# Patient Record
Sex: Female | Born: 1985 | Race: Black or African American | Hispanic: No | Marital: Single | Smoking: Former smoker
Health system: Southern US, Community
[De-identification: ages and names within clinical notes are randomized; demographics above are authoritative.]

## PROBLEM LIST (undated history)

## (undated) DIAGNOSIS — I10 Essential (primary) hypertension: Secondary | ICD-10-CM

## (undated) HISTORY — PX: KNEE SURGERY: SHX244

---

## 2011-04-08 ENCOUNTER — Emergency Department (HOSPITAL_BASED_OUTPATIENT_CLINIC_OR_DEPARTMENT_OTHER)
Admission: EM | Admit: 2011-04-08 | Discharge: 2011-04-08 | Disposition: A | Discharge status: Home / Self Care | Payer: Self-pay | Attending: Emergency Medicine | Admitting: Emergency Medicine

## 2011-04-08 ENCOUNTER — Encounter: Payer: Self-pay | Admitting: Emergency Medicine

## 2011-04-08 DIAGNOSIS — K029 Dental caries, unspecified: Secondary | ICD-10-CM | POA: Insufficient documentation

## 2011-04-08 MED ORDER — HYDROCODONE-ACETAMINOPHEN 5-325 MG PO TABS
2.0000 | ORAL_TABLET | Freq: Once | ORAL | Status: AC
  Administered 2011-04-08: 2 via ORAL
  Filled 2011-04-08: qty 2

## 2011-04-08 MED ORDER — CLINDAMYCIN HCL 150 MG PO CAPS: 150.0000 mg | ORAL_CAPSULE | Freq: Four times a day (QID) | ORAL | Status: AC

## 2011-04-08 MED ORDER — HYDROCODONE-ACETAMINOPHEN 5-325 MG PO TABS: 2.0000 | ORAL_TABLET | ORAL | Status: AC | PRN

## 2011-04-08 NOTE — ED Provider Notes (Signed)
History     CSN: [id number] Arrival date & time: 04/08/2011  1:18 PM   First MD Initiated Contact with Patient 04/08/11 1323      Chief Complaint  Patient presents with  . Dental Pain    (Consider location/radiation/quality/duration/timing/severity/associated sxs/prior treatment) Patient is a 25 y.o. female presenting with tooth pain. The history is provided by the patient. No language interpreter was used.  Dental PainThe primary symptoms include mouth pain and oral lesions. The symptoms began less than 1 hour ago. The symptoms are worsening. The symptoms are chronic. The symptoms occur constantly.  Mouth pain began more than 1 week ago. Mouth pain occurs constantly. Mouth pain is worsening. Affected locations include: teeth. At its highest the mouth pain was at 7/10. The mouth pain is currently at 7/10.  Additional symptoms include: dental sensitivity to temperature and gum swelling. Medical issues do not include: alcohol problem and smoking.    History reviewed. No pertinent past medical history.  Past Surgical History  Procedure Date  . Knee surgery     History reviewed. No pertinent family history.  History  Substance Use Topics  . Smoking status: Not on file  . Smokeless tobacco: Not on file  . Alcohol Use: Yes     social drinker    OB History    Grav Para Term Preterm Abortions TAB SAB Ect Mult Living                  Review of Systems  Unable to perform ROS HENT: Positive for dental problem.   All other systems reviewed and are negative.    Allergies  Review of patient's allergies indicates no known allergies.  Home Medications  No current outpatient prescriptions on file.  BP 140/96  Pulse 60  Temp(Src) 97.7 F (36.5 C) (Oral)  Resp 12  Ht 5\' 5"  (1.651 m)  Wt 270 lb (122.471 kg)  BMI 44.93 kg/m2  SpO2 98%  Physical Exam  Nursing note and vitals reviewed. Constitutional: She appears well-developed and well-nourished.  HENT:  Head:  Normocephalic and atraumatic.  Right Ear: External ear normal.  Left Ear: External ear normal.  Nose: Nose normal.  Mouth/Throat: Oropharynx is clear and moist.       Broken right lower 1st molar,  Multiple caries  Eyes: Pupils are equal, round, and reactive to light.  Neck: Normal range of motion.  Cardiovascular: Normal rate.   Pulmonary/Chest: Effort normal.  Abdominal: Soft.  Musculoskeletal: Normal range of motion.  Neurological: She is alert.  Skin: Skin is warm.  Psychiatric: She has a normal mood and affect.    ED Course  Procedures (including critical care time)  Labs Reviewed - No data to display No results found.   No diagnosis found.    MDM  Pt referred to dentist for evaluation        Langston Masker, [place] 04/08/11 1334  Langston Masker, [place] 04/08/11 1342

## 2011-04-08 NOTE — ED Provider Notes (Signed)
Medical screening examination/treatment/procedure(s) were performed by non-physician practitioner and as supervising physician I was immediately available for consultation/collaboration.   Lyanne Co, MD 04/08/11 [phone]

## 2011-04-08 NOTE — ED Notes (Signed)
Pt has toothpain located lower right jaw.  States tooth is cracked. Can't eat. Has headache and earache.

## 2011-07-23 ENCOUNTER — Encounter (HOSPITAL_BASED_OUTPATIENT_CLINIC_OR_DEPARTMENT_OTHER): Payer: Self-pay | Admitting: Student

## 2011-07-23 ENCOUNTER — Emergency Department (HOSPITAL_BASED_OUTPATIENT_CLINIC_OR_DEPARTMENT_OTHER)
Admission: EM | Admit: 2011-07-23 | Discharge: 2011-07-23 | Disposition: A | Discharge status: Home / Self Care | Payer: Self-pay | Attending: Emergency Medicine | Admitting: Emergency Medicine

## 2011-07-23 DIAGNOSIS — N760 Acute vaginitis: Secondary | ICD-10-CM | POA: Insufficient documentation

## 2011-07-23 DIAGNOSIS — B9689 Other specified bacterial agents as the cause of diseases classified elsewhere: Secondary | ICD-10-CM | POA: Insufficient documentation

## 2011-07-23 DIAGNOSIS — R10819 Abdominal tenderness, unspecified site: Secondary | ICD-10-CM | POA: Insufficient documentation

## 2011-07-23 DIAGNOSIS — F17200 Nicotine dependence, unspecified, uncomplicated: Secondary | ICD-10-CM | POA: Insufficient documentation

## 2011-07-23 DIAGNOSIS — A499 Bacterial infection, unspecified: Secondary | ICD-10-CM | POA: Insufficient documentation

## 2011-07-23 DIAGNOSIS — R110 Nausea: Secondary | ICD-10-CM | POA: Insufficient documentation

## 2011-07-23 LAB — URINE MICROSCOPIC-ADD ON

## 2011-07-23 LAB — URINALYSIS, ROUTINE W REFLEX MICROSCOPIC
Nitrite: NEGATIVE
Protein, ur: NEGATIVE mg/dL
Specific Gravity, Urine: 1.03 (ref 1.005–1.030)
Urobilinogen, UA: 0.2 mg/dL (ref 0.0–1.0)

## 2011-07-23 LAB — WET PREP, GENITAL
Trich, Wet Prep: NONE SEEN
Yeast Wet Prep HPF POC: NONE SEEN

## 2011-07-23 LAB — PREGNANCY, URINE: Preg Test, Ur: NEGATIVE

## 2011-07-23 MED ORDER — METRONIDAZOLE 500 MG PO TABS: 500.0000 mg | ORAL_TABLET | Freq: Two times a day (BID) | ORAL | Status: AC

## 2011-07-23 NOTE — ED Notes (Signed)
Pt in with c/o lower abdominal pain and N since Monday. Denies V D CP LOC SOB.Denies any prior hx. Denies dysuria, vaginal discharge.

## 2011-07-23 NOTE — Discharge Instructions (Signed)
Bacterial Vaginosis Bacterial vaginosis (BV) is a vaginal infection where the normal balance of bacteria in the vagina is disrupted. The normal balance is then replaced by an overgrowth of certain bacteria. There are several different kinds of bacteria that can cause BV. BV is the most common vaginal infection in women of childbearing age. CAUSES   The cause of BV is not fully understood. BV develops when there is an increase or imbalance of harmful bacteria.   Some activities or behaviors can upset the normal balance of bacteria in the vagina and put women at increased risk including:   Having a new sex partner or multiple sex partners.   Douching.   Using an intrauterine device (IUD) for contraception.   It is not clear what role sexual activity plays in the development of BV. However, women that have never had sexual intercourse are rarely infected with BV.  Women do not get BV from toilet seats, bedding, swimming pools or from touching objects around them.  SYMPTOMS   Grey vaginal discharge.   A fish-like odor with discharge, especially after sexual intercourse.   Itching or burning of the vagina and vulva.   Burning or pain with urination.   Some women have no signs or symptoms at all.  DIAGNOSIS  Your caregiver must examine the vagina for signs of BV. Your caregiver will perform lab tests and look at the sample of vaginal fluid through a microscope. They will look for bacteria and abnormal cells (clue cells), a pH test higher than 4.5, and a positive amine test all associated with BV.  RISKS AND COMPLICATIONS   Pelvic inflammatory disease (PID).   Infections following gynecology surgery.   Developing HIV.   Developing herpes virus.  TREATMENT  Sometimes BV will clear up without treatment. However, all women with symptoms of BV should be treated to avoid complications, especially if gynecology surgery is planned. Female partners generally do not need to be treated. However,  BV may spread between female sex partners so treatment is helpful in preventing a recurrence of BV.   BV may be treated with antibiotics. The antibiotics come in either pill or vaginal cream forms. Either can be used with nonpregnant or pregnant women, but the recommended dosages differ. These antibiotics are not harmful to the baby.   BV can recur after treatment. If this happens, a second round of antibiotics will often be prescribed.   Treatment is important for pregnant women. If not treated, BV can cause a premature delivery, especially for a pregnant woman who had a premature birth in the past. All pregnant women who have symptoms of BV should be checked and treated.   For chronic reoccurrence of BV, treatment with a type of prescribed gel vaginally twice a week is helpful.  HOME CARE INSTRUCTIONS   Finish all medication as directed by your caregiver.   Do not have sex until treatment is completed.   Tell your sexual partner that you have a vaginal infection. They should see their caregiver and be treated if they have problems, such as a mild rash or itching.   Practice safe sex. Use condoms. Only have 1 sex partner.  PREVENTION  Basic prevention steps can help reduce the risk of upsetting the natural balance of bacteria in the vagina and developing BV:  Do not have sexual intercourse (be abstinent).   Do not douche.   Use all of the medicine prescribed for treatment of BV, even if the signs and symptoms go away.     Tell your sex partner if you have BV. That way, they can be treated, if needed, to prevent reoccurrence.  SEEK MEDICAL CARE IF:   Your symptoms are not improving after 3 days of treatment.   You have increased discharge, pain, or fever.  MAKE SURE YOU:   Understand these instructions.   Will watch your condition.   Will get help right away if you are not doing well or get worse.  FOR MORE INFORMATION  Division of STD Prevention (DSTDP), Centers for Disease  Control and Prevention: www.cdc.gov/std American [employer] Association (ASHA): www.ashastd.org  Document Released: 04/21/2005 Document Revised: 04/10/2011 Document Reviewed: 10/12/2008 Exit[employer] Patient Information 2012 [employer]. 

## 2011-07-23 NOTE — ED Provider Notes (Signed)
History     CSN: [id number]  Arrival date & time 07/23/11  2130   First MD Initiated Contact with Patient 07/23/11 1922      Chief Complaint  Patient presents with  . Abdominal Pain  . Nausea    (Consider location/radiation/quality/duration/timing/severity/associated sxs/prior treatment) Patient is a 26 y.o. female presenting with abdominal pain. The history is provided by the patient.  Abdominal Pain The primary symptoms of the illness include abdominal pain and nausea. The primary symptoms of the illness do not include vomiting, dysuria, vaginal discharge or vaginal bleeding. Episode onset: Symptoms started 2 weeks ago but worsened since Monday. The onset of the illness was gradual. The problem has been gradually worsening.  The abdominal pain is located in the suprapubic region. The abdominal pain does not radiate. The severity of the abdominal pain is 5/10. The abdominal pain is relieved by nothing. Exacerbated by: nothing.  The patient states that she believes she is currently not pregnant. Additional symptoms associated with the illness include back pain. Symptoms associated with the illness do not include chills, anorexia, constipation, urgency or frequency. Associated medical issues comments: Has not had her period since February.    No past medical history on file.  Past Surgical History  Procedure Date  . Knee surgery     No family history on file.  History  Substance Use Topics  . Smoking status: Current Everyday Smoker -- 1.0 packs/day  . Smokeless tobacco: Not on file  . Alcohol Use: Yes     social drinker    OB History    Grav Para Term Preterm Abortions TAB SAB Ect Mult Living                  Review of Systems  Constitutional: Negative for chills.  Gastrointestinal: Positive for nausea and abdominal pain. Negative for vomiting, constipation and anorexia.  Genitourinary: Negative for dysuria, urgency, frequency, vaginal bleeding and vaginal discharge.    Musculoskeletal: Positive for back pain.  All other systems reviewed and are negative.    Allergies  Review of patient's allergies indicates no known allergies.  Home Medications  No current outpatient prescriptions on file.  BP 152/81  Pulse 72  Temp(Src) 98.2 F (36.8 C) (Oral)  Resp 20  Wt 261 lb 4.8 oz (118.525 kg)  SpO2 99%  LMP 06/09/2011  Physical Exam  Nursing note and vitals reviewed. Constitutional: She is oriented to person, place, and time. She appears well-developed and well-nourished. No distress.  HENT:  Head: Normocephalic and atraumatic.  Eyes: EOM are normal. Pupils are equal, round, and reactive to light.  Cardiovascular: Normal rate, regular rhythm, normal heart sounds and intact distal pulses.  Exam reveals no friction rub.   No murmur heard. Pulmonary/Chest: Effort normal and breath sounds normal. She has no wheezes. She has no rales.  Abdominal: Soft. Bowel sounds are normal. She exhibits no distension. There is tenderness in the suprapubic area. There is no rebound, no guarding and no CVA tenderness.  Genitourinary: Vagina normal. Uterus is tender. Cervix exhibits discharge. Cervix exhibits no motion tenderness. Right adnexum displays no mass, no tenderness and no fullness. Left adnexum displays no mass, no tenderness and no fullness.  Musculoskeletal: Normal range of motion. She exhibits no tenderness.       No edema  Neurological: She is alert and oriented to person, place, and time. No cranial nerve deficit.  Skin: Skin is warm and dry. No rash noted.  Psychiatric: She has a normal mood  and affect. Her behavior is normal.    ED Course  Procedures (including critical care time)  Labs Reviewed  URINALYSIS, ROUTINE W REFLEX MICROSCOPIC - Abnormal; Notable for the following:    APPearance CLOUDY (*)    Hgb urine dipstick SMALL (*)    All other components within normal limits  WET PREP, GENITAL - Abnormal; Notable for the following:    Clue Cells  Wet Prep HPF POC MODERATE (*)    WBC, Wet Prep HPF POC FEW (*)    All other components within normal limits  URINE MICROSCOPIC-ADD ON - Abnormal; Notable for the following:    Squamous Epithelial / LPF FEW (*)    Bacteria, UA FEW (*)    All other components within normal limits  PREGNANCY, URINE  GC/CHLAMYDIA PROBE AMP, GENITAL   No results found.   No diagnosis found.    MDM   Patient with suprapubic pain that has been worsening for the last 2 weeks and nausea without vomiting. Denies any fever, bowel changes. LMP was the beginning of February. Patient states she's taking multiple pregnancy tests at home that have been negative. She denies any dysuria or vaginal discharge. No new partners and is only sexually active with one person. Pelvic exam has uterine tenderness but no other concerning symptoms. UPT negative, UA negative for signs of infection. Wet prep pending. No symptoms on exam concerning for appendicitis, diverticulitis, pancreatitis or cholecystitis. Symptoms do not suggest pyelonephritis or renal calculi at this time.  9:11 PM A negative for infection. UPT within normal limits. Wet prep moderate clue cells but otherwise negative. Patient treated with Flagyl and given a followup to women's clinic.      Gwyneth Sprout, MD 07/23/11 2111

## 2011-07-24 LAB — GC/CHLAMYDIA PROBE AMP, GENITAL: GC Probe Amp, Genital: NEGATIVE

## 2011-07-25 NOTE — ED Notes (Signed)
+   Chlamydia Chart sent to EDP  Office for review.

## 2011-07-26 NOTE — ED Notes (Signed)
Chart returned from EDP office. Patient prescribed Doxycycline 100 mg PO BID x 7 days. Prescribed by Trixie Dredge PA-C.

## 2011-07-27 NOTE — ED Notes (Signed)
Attempted to call patient yesterday and today. No answer.

## 2012-02-24 ENCOUNTER — Encounter (HOSPITAL_BASED_OUTPATIENT_CLINIC_OR_DEPARTMENT_OTHER): Payer: Self-pay | Admitting: *Deleted

## 2012-02-24 ENCOUNTER — Emergency Department (HOSPITAL_BASED_OUTPATIENT_CLINIC_OR_DEPARTMENT_OTHER): Payer: Medicaid Other

## 2012-02-24 ENCOUNTER — Emergency Department (HOSPITAL_BASED_OUTPATIENT_CLINIC_OR_DEPARTMENT_OTHER)
Admission: EM | Admit: 2012-02-24 | Discharge: 2012-02-24 | Disposition: A | Discharge status: Home / Self Care | Payer: Medicaid Other | Attending: Emergency Medicine | Admitting: Emergency Medicine

## 2012-02-24 DIAGNOSIS — Z87891 Personal history of nicotine dependence: Secondary | ICD-10-CM | POA: Insufficient documentation

## 2012-02-24 DIAGNOSIS — R1110 Vomiting, unspecified: Secondary | ICD-10-CM

## 2012-02-24 DIAGNOSIS — O2390 Unspecified genitourinary tract infection in pregnancy, unspecified trimester: Secondary | ICD-10-CM | POA: Insufficient documentation

## 2012-02-24 DIAGNOSIS — F1210 Cannabis abuse, uncomplicated: Secondary | ICD-10-CM | POA: Insufficient documentation

## 2012-02-24 DIAGNOSIS — O210 Mild hyperemesis gravidarum: Secondary | ICD-10-CM | POA: Insufficient documentation

## 2012-02-24 DIAGNOSIS — R1013 Epigastric pain: Secondary | ICD-10-CM | POA: Insufficient documentation

## 2012-02-24 DIAGNOSIS — N390 Urinary tract infection, site not specified: Secondary | ICD-10-CM

## 2012-02-24 LAB — COMPREHENSIVE METABOLIC PANEL
BUN: 7 mg/dL (ref 6–23)
CO2: 25 mEq/L (ref 19–32)
Chloride: 100 mEq/L (ref 96–112)
Creatinine, Ser: 0.5 mg/dL (ref 0.50–1.10)
GFR calc Af Amer: >90 mL/min (ref >90)
GFR calc non Af Amer: >90 mL/min (ref >90)
Glucose, Bld: 85 mg/dL (ref 70–99)
Total Bilirubin: 0.2 mg/dL — ABNORMAL LOW (ref 0.3–1.2)

## 2012-02-24 LAB — CBC WITH DIFFERENTIAL/PLATELET
HCT: 36.9 % (ref 36.0–46.0)
Hemoglobin: 12.9 g/dL (ref 12.0–15.0)
Lymphocytes Relative: 38 % (ref 12–46)
MCV: 85.6 fL (ref 78.0–100.0)
Monocytes Absolute: 0.8 10*3/uL (ref 0.1–1.0)
Monocytes Relative: 14 % — ABNORMAL HIGH (ref 3–12)
Neutro Abs: 2.8 10*3/uL (ref 1.7–7.7)
WBC: 6.1 10*3/uL (ref 4.0–10.5)

## 2012-02-24 LAB — URINE MICROSCOPIC-ADD ON

## 2012-02-24 LAB — URINALYSIS, ROUTINE W REFLEX MICROSCOPIC
Bilirubin Urine: NEGATIVE
Ketones, ur: NEGATIVE mg/dL
Nitrite: NEGATIVE
Specific Gravity, Urine: 1.023 (ref 1.005–1.030)
Urobilinogen, UA: 0.2 mg/dL (ref 0.0–1.0)

## 2012-02-24 LAB — HCG, QUANTITATIVE, PREGNANCY: hCG, Beta Chain, Quant, S: 55426 m[IU]/mL — ABNORMAL HIGH (ref <5)

## 2012-02-24 LAB — LIPASE, BLOOD: Lipase: 12 U/L (ref 11–59)

## 2012-02-24 MED ORDER — ONDANSETRON HCL 4 MG/2ML IJ SOLN: 4.0000 mg | Freq: Once | INTRAMUSCULAR | Status: DC

## 2012-02-24 MED ORDER — NITROFURANTOIN MONOHYD MACRO 100 MG PO CAPS
100.0000 mg | ORAL_CAPSULE | Freq: Once | ORAL | Status: AC
  Administered 2012-02-24: 100 mg via ORAL
  Filled 2012-02-24: qty 1

## 2012-02-24 MED ORDER — SODIUM CHLORIDE 0.9 % IV BOLUS (SEPSIS)
1000.0000 mL | Freq: Once | INTRAVENOUS | Status: AC
  Administered 2012-02-24: 1000 mL via INTRAVENOUS

## 2012-02-24 MED ORDER — METOCLOPRAMIDE HCL 10 MG PO TABS: 10.0000 mg | ORAL_TABLET | Freq: Four times a day (QID) | ORAL | Status: AC | PRN

## 2012-02-24 MED ORDER — NITROFURANTOIN MONOHYD MACRO 100 MG PO CAPS: 100.0000 mg | ORAL_CAPSULE | Freq: Two times a day (BID) | ORAL | Status: AC

## 2012-02-24 NOTE — ED Provider Notes (Signed)
History     CSN: [id number]  Arrival date & time 02/24/12  1114   First MD Initiated Contact with Patient 02/24/12 1209      Chief Complaint  Patient presents with  . Abdominal Pain    (Consider location/radiation/quality/duration/timing/severity/associated sxs/prior treatment) The history is provided by the patient.  Kourtni Gregor is a 26 y.o. female here with abdominal pain and vomiting. She has intermittent vomiting for the last 2 weeks associated with epigastric pain. She also had some epigastric cramps as well. Denies any dysuria or fevers or constipation or diarrhea. LMP 2 months ago and denies vaginal discharge. She chronically uses marijuana and states that using marijuana makes it feel better.    History reviewed. No pertinent past medical history.  Past Surgical History  Procedure Date  . Knee surgery     No family history on file.  History  Substance Use Topics  . Smoking status: Former Smoker -- 1.0 packs/day    Types: Cigarettes  . Smokeless tobacco: Not on file  . Alcohol Use: No     social drinker    OB History    Grav Para Term Preterm Abortions TAB SAB Ect Mult Living                  Review of Systems  Gastrointestinal: Positive for nausea, vomiting and abdominal pain.  All other systems reviewed and are negative.    Allergies  Review of patient's allergies indicates no known allergies.  Home Medications   Current Outpatient Rx  Name Route Sig Dispense Refill  . METOCLOPRAMIDE HCL 10 MG PO TABS Oral Take 1 tablet (10 mg total) by mouth every 6 (six) hours as needed (nausea/headache). 10 tablet 0    BP 143/84  Pulse 63  Temp 98.4 F (36.9 C) (Oral)  Resp 20  Ht 5\' 3"  (1.6 m)  Wt 257 lb (116.574 kg)  BMI 45.53 kg/m2  SpO2 99%  LMP 12/30/2011  Physical Exam  Nursing note and vitals reviewed. Constitutional: She is oriented to person, place, and time. She appears well-developed and well-nourished.  HENT:  Head: Normocephalic.    Mouth/Throat: Oropharynx is clear and moist.  Eyes: Conjunctivae normal are normal. Pupils are equal, round, and reactive to light.  Neck: Normal range of motion. Neck supple.  Cardiovascular: Normal rate, regular rhythm and normal heart sounds.   Pulmonary/Chest: Effort normal and breath sounds normal.  Abdominal: Soft. Bowel sounds are normal.       Obese, mild epigastric tenderness, no RUQ tenderness. No lower abdominal tenderness.   Musculoskeletal: Normal range of motion.  Neurological: She is alert and oriented to person, place, and time.  Skin: Skin is warm and dry.  Psychiatric: She has a normal mood and affect. Her behavior is normal. Judgment and thought content normal.    ED Course  Procedures (including critical care time)  Labs Reviewed  URINALYSIS, ROUTINE W REFLEX MICROSCOPIC - Abnormal; Notable for the following:    Leukocytes, UA SMALL (*)     All other components within normal limits  PREGNANCY, URINE - Abnormal; Notable for the following:    Preg Test, Ur POSITIVE (*)     All other components within normal limits  URINE MICROSCOPIC-ADD ON - Abnormal; Notable for the following:    Squamous Epithelial / LPF FEW (*)     Bacteria, UA FEW (*)     All other components within normal limits  CBC WITH DIFFERENTIAL - Abnormal; Notable for the following:  Monocytes Relative 14 (*)     All other components within normal limits  COMPREHENSIVE METABOLIC PANEL - Abnormal; Notable for the following:    Total Bilirubin 0.2 (*)     All other components within normal limits  LIPASE, BLOOD  HCG, QUANTITATIVE, PREGNANCY   US Ob Comp Less 14 Wks  02/24/2012  *RADIOLOGY REPORT*  Clinical Data: Left-sided pain, nausea and vomiting, positive pregnancy test  OBSTETRIC <14 WK [place] AND TRANSVAGINAL OB US  Technique:  Both transabdominal and transvaginal ultrasound examinations were performed for complete evaluation of the gestation as well as the maternal uterus, adnexal regions, and  pelvic cul-de-sac.  Transvaginal technique was performed to assess early pregnancy.  Comparison:  None.  Intrauterine gestational sac:  Visualized/normal in shape. Yolk sac: Visualized Embryo: Visualized Cardiac Activity: Visualized Heart Rate: 158 bpm  CRL: 13  mm  7 w  3 d         [place] EDC: 10/09/12  Maternal uterus/adnexae: Ovaries not visualized.  No adnexal mass.  No free fluid.  IMPRESSION: Intrauterine gestational sac, yolk sac, and fetal pole identified. Gestational age by today's crown-rump length 7 weeks 3 days is concordant with assigned gestational age by LMP of 8 weeks 0 days, EDC by LMP 10/05/2012.  No acute abnormality.   Original Report Authenticated By: Harrel Lemon, M.D.    US Ob Transvaginal  02/24/2012  *RADIOLOGY REPORT*  Clinical Data: Left-sided pain, nausea and vomiting, positive pregnancy test  OBSTETRIC <14 WK [place] AND TRANSVAGINAL OB US  Technique:  Both transabdominal and transvaginal ultrasound examinations were performed for complete evaluation of the gestation as well as the maternal uterus, adnexal regions, and pelvic cul-de-sac.  Transvaginal technique was performed to assess early pregnancy.  Comparison:  None.  Intrauterine gestational sac:  Visualized/normal in shape. Yolk sac: Visualized Embryo: Visualized Cardiac Activity: Visualized Heart Rate: 158 bpm  CRL: 13  mm  7 w  3 d         [place] EDC: 10/09/12  Maternal uterus/adnexae: Ovaries not visualized.  No adnexal mass.  No free fluid.  IMPRESSION: Intrauterine gestational sac, yolk sac, and fetal pole identified. Gestational age by today's crown-rump length 7 weeks 3 days is concordant with assigned gestational age by LMP of 8 weeks 0 days, EDC by LMP 10/05/2012.  No acute abnormality.   Original Report Authenticated By: Harrel Lemon, M.D.      1. Hyperemesis       MDM  Emmaree Dampier is a 26 y.o. female here with abdominal pain, vomiting. + UCG, likely having hyperemesis symptoms. Will give IVF, check labs to r/o  liver or gallbladder pathology. Will do US transvag to confirm IUP. Will f/u outpatient.   1:54 PM Labs nl, UA showed + UTI, will d/c on macrobid. US showed live IUP. Patient not vomiting, tolerated PO fluids. I gave her reglan to go home with. She will f/u with women's clinic (she has no insurance).        Richardean Canal, MD 02/24/12 1355

## 2012-02-24 NOTE — ED Notes (Signed)
Patient transported to Ultrasound 

## 2012-02-24 NOTE — ED Notes (Signed)
Patient states she has a 2 week history of upper abdominal pain which is associated with vomiting after eating.  Normal BM's , last 10/20.

## 2014-02-10 IMAGING — US US OB TRANSVAGINAL
1 series · 14 of 28 positions shown · non-contrast
Comparison: None.

CLINICAL DATA: Left-sided pain, nausea and vomiting, positive
pregnancy test

OBSTETRIC <14 WK US AND TRANSVAGINAL OB US
TECHNIQUE: Both transabdominal and transvaginal ultrasound
examinations were performed for complete evaluation of the
gestation as well as the maternal uterus, adnexal regions, and
pelvic cul-de-sac.  Transvaginal technique was performed to assess
early pregnancy.

[Series 1: us ob transvaginal · 0.30mm/px · 14 of 47 slices shown]
[im 2/47]
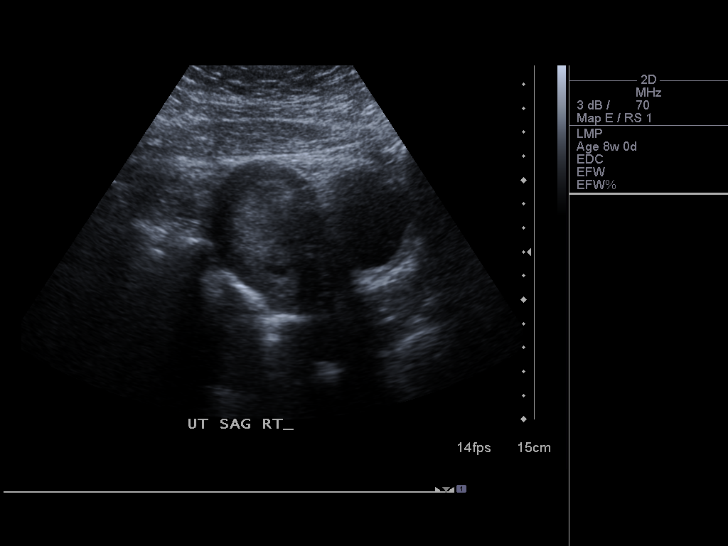
[im 6/47]
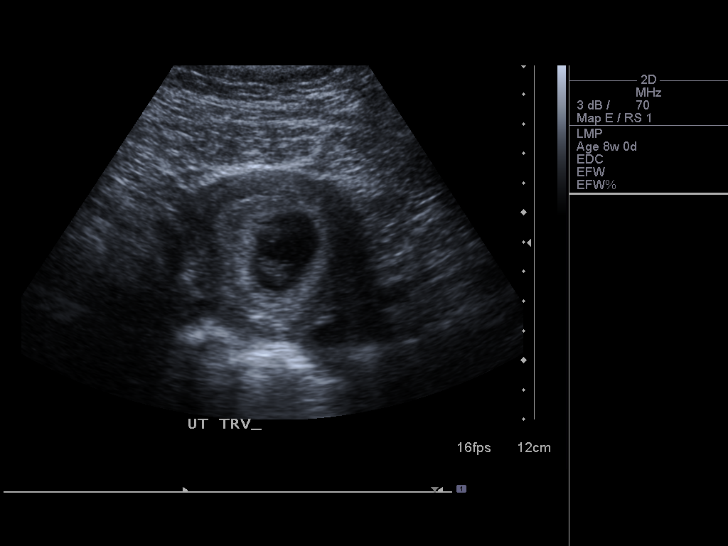
[im 9/47]
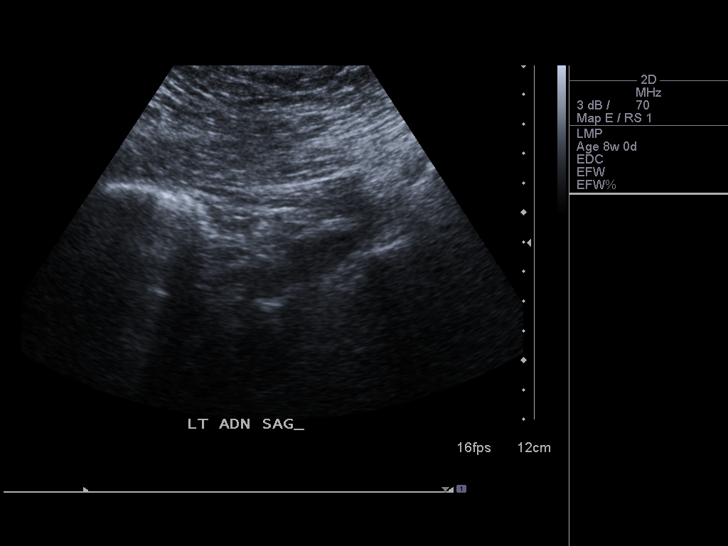
[im 12/47]
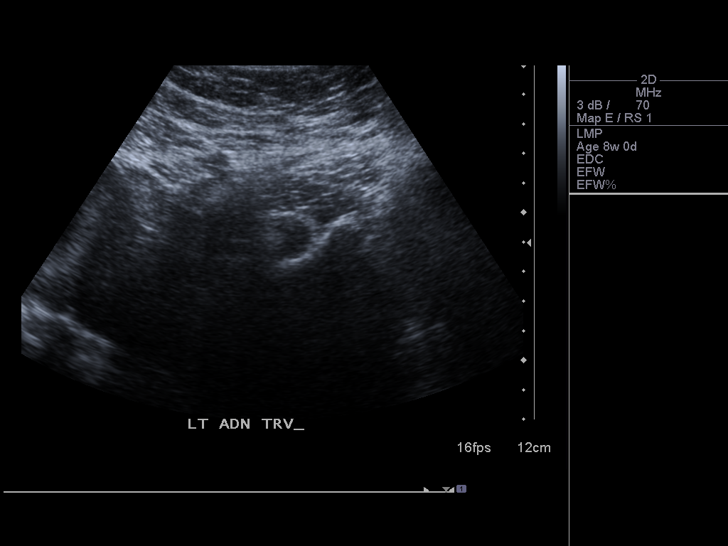
[im 16/47]
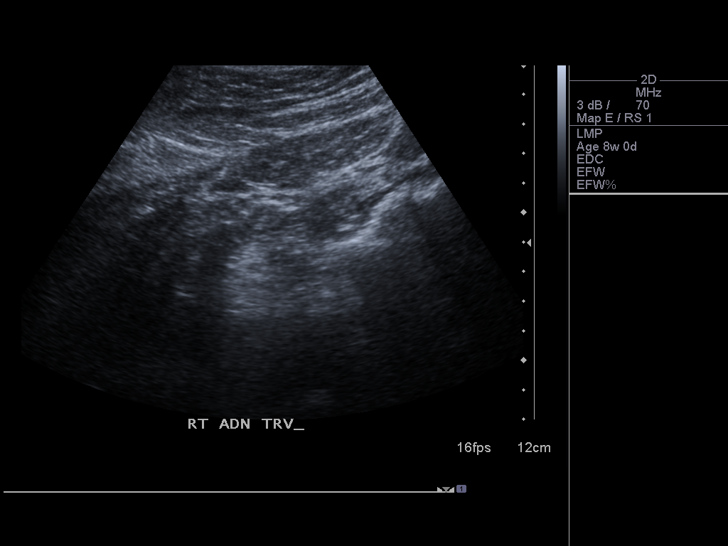
[im 19/47]
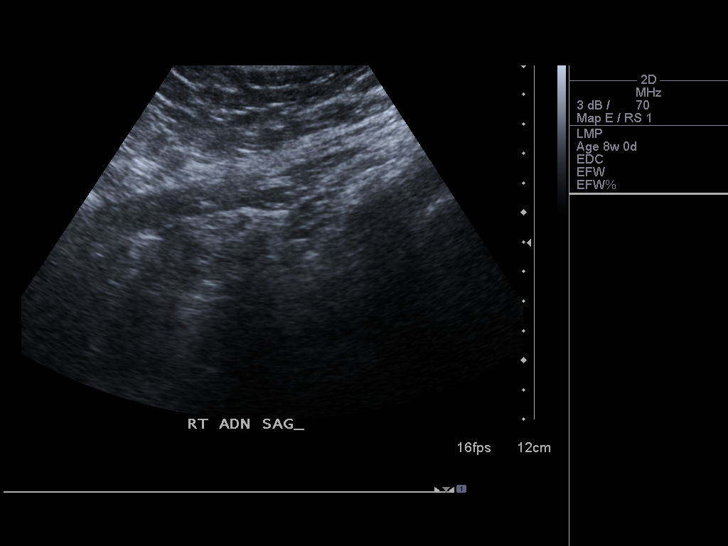
[im 23/47]
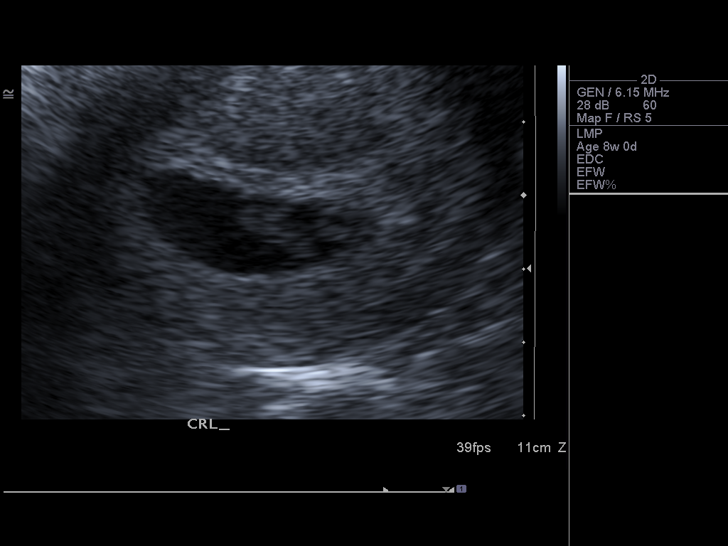
[im 26/47]
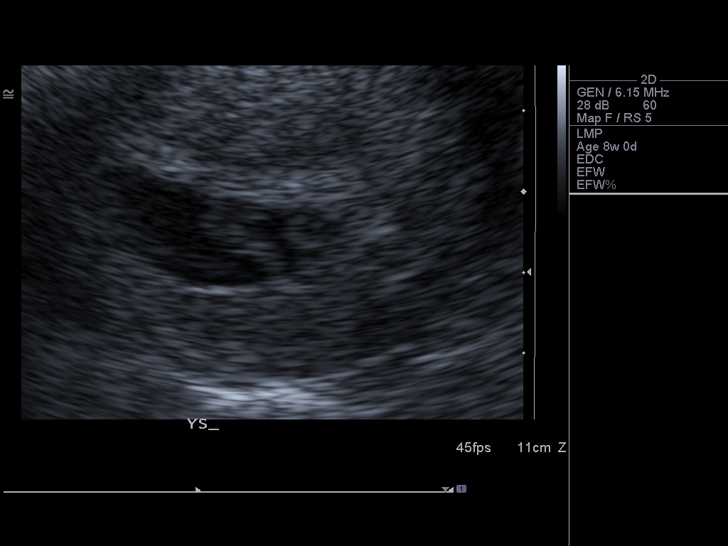
[im 29/47]
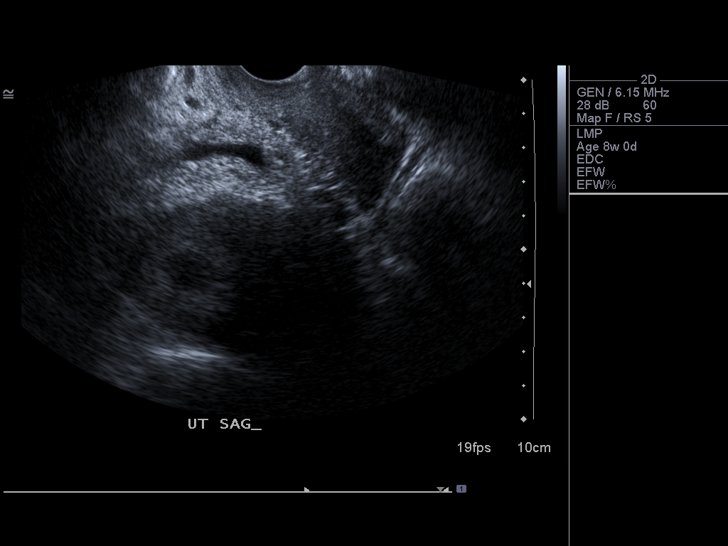
[im 33/47]
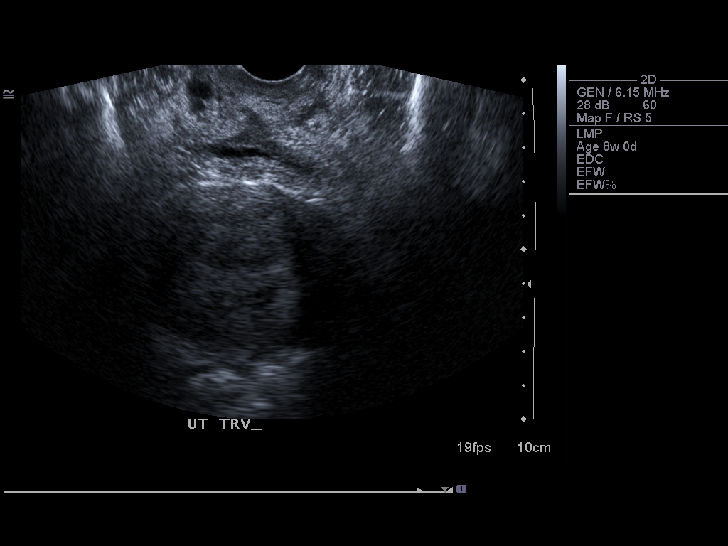
[im 36/47]
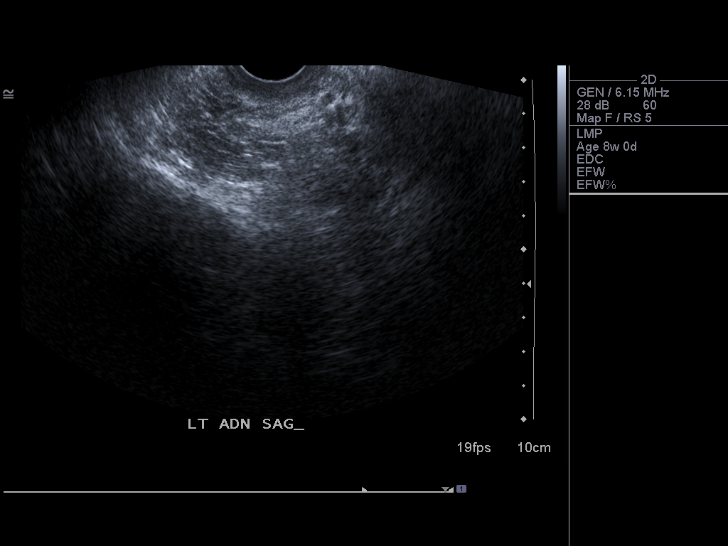
[im 40/47]
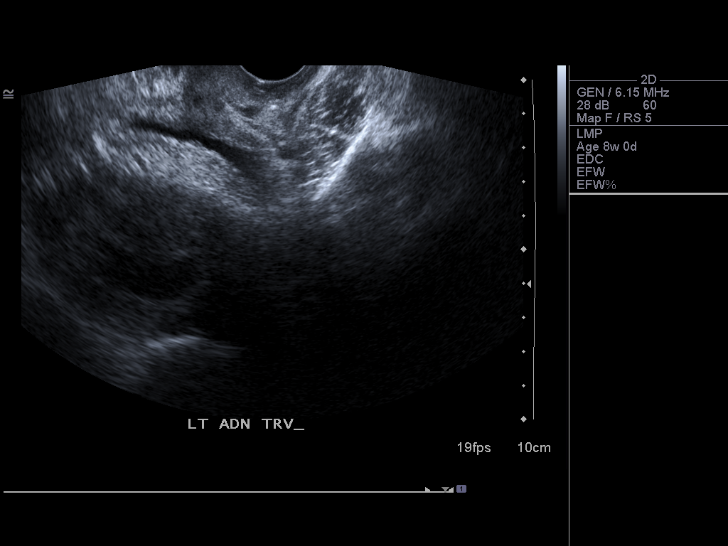
[im 43/47]
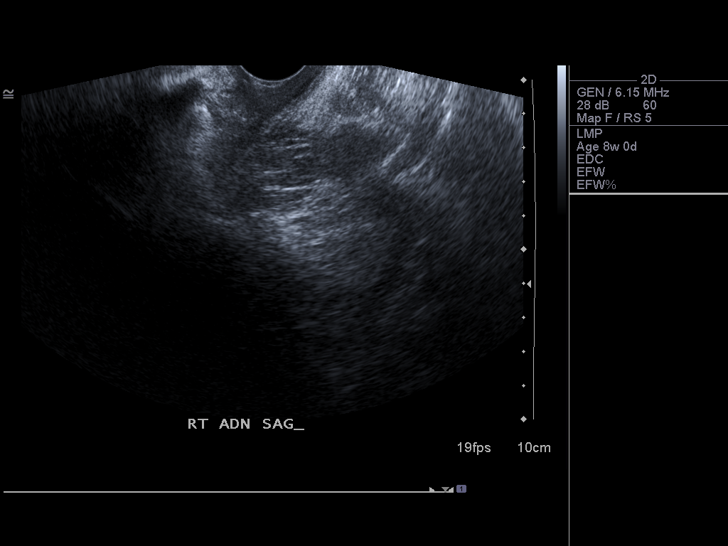
[im 47/47]
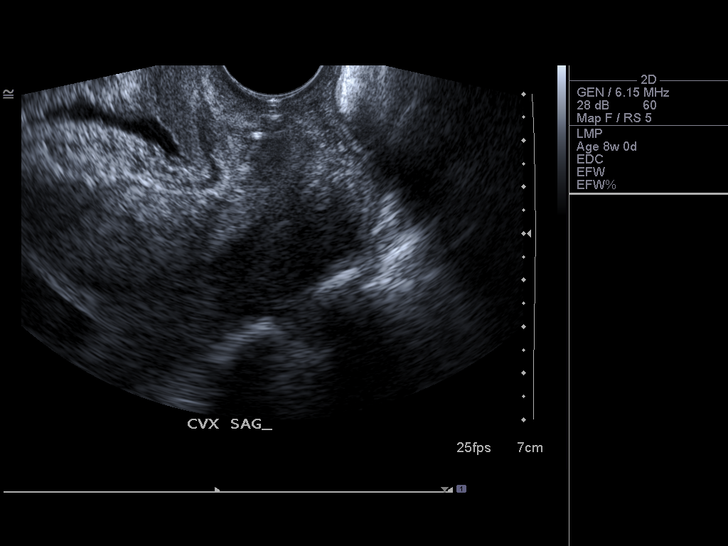

[14 of 28 positions shown; findings below may reference images not displayed]

Intrauterine gestational sac:  Visualized/normal in shape.
Yolk sac: Visualized
Embryo: Visualized
Cardiac Activity: Visualized
Heart Rate: 158 bpm

CRL: 13  mm  7 w  3 d         US EDC: 10/09/12

Maternal uterus/adnexae:
Ovaries not visualized.  No adnexal mass.  No free fluid.
IMPRESSION: Intrauterine gestational sac, yolk sac, and fetal pole identified.
Gestational age by today's crown-rump length 7 weeks 3 days is
concordant with assigned gestational age by LMP of 8 weeks 0 days,
EDC by LMP 10/05/2012.  No acute abnormality.

## 2016-05-07 ENCOUNTER — Emergency Department (HOSPITAL_BASED_OUTPATIENT_CLINIC_OR_DEPARTMENT_OTHER)
Admission: EM | Admit: 2016-05-07 | Discharge: 2016-05-07 | Disposition: A | Discharge status: Home / Self Care | Payer: Medicaid Other | Attending: Emergency Medicine | Admitting: Emergency Medicine

## 2016-05-07 ENCOUNTER — Encounter (HOSPITAL_BASED_OUTPATIENT_CLINIC_OR_DEPARTMENT_OTHER): Payer: Self-pay | Admitting: Emergency Medicine

## 2016-05-07 DIAGNOSIS — Z87891 Personal history of nicotine dependence: Secondary | ICD-10-CM | POA: Insufficient documentation

## 2016-05-07 DIAGNOSIS — K529 Noninfective gastroenteritis and colitis, unspecified: Secondary | ICD-10-CM | POA: Insufficient documentation

## 2016-05-07 LAB — COMPREHENSIVE METABOLIC PANEL
ALBUMIN: 4 g/dL (ref 3.5–5.0)
ALT: 14 U/L (ref 14–54)
ANION GAP: 5 (ref 5–15)
AST: 16 U/L (ref 15–41)
Alkaline Phosphatase: 57 U/L (ref 38–126)
BUN: 9 mg/dL (ref 6–20)
CALCIUM: 8.2 mg/dL — AB (ref 8.9–10.3)
CO2: 26 mmol/L (ref 22–32)
Chloride: 107 mmol/L (ref 101–111)
Creatinine, Ser: 0.63 mg/dL (ref 0.44–1.00)
GFR calc non Af Amer: >60 mL/min (ref >60)
GLUCOSE: 82 mg/dL (ref 65–99)
Potassium: 3.5 mmol/L (ref 3.5–5.1)
SODIUM: 138 mmol/L (ref 135–145)
Total Bilirubin: 0.2 mg/dL — ABNORMAL LOW (ref 0.3–1.2)
Total Protein: 7.8 g/dL (ref 6.5–8.1)

## 2016-05-07 LAB — CBC
HEMATOCRIT: 41 % (ref 36.0–46.0)
HEMOGLOBIN: 13.6 g/dL (ref 12.0–15.0)
MCH: 30.1 pg (ref 26.0–34.0)
MCHC: 33.2 g/dL (ref 30.0–36.0)
MCV: 90.7 fL (ref 78.0–100.0)
Platelets: 341 10*3/uL (ref 150–400)
RBC: 4.52 MIL/uL (ref 3.87–5.11)
RDW: 12.8 % (ref 11.5–15.5)
WBC: 4.5 10*3/uL (ref 4.0–10.5)

## 2016-05-07 LAB — URINALYSIS, MICROSCOPIC (REFLEX): WBC UA: NONE SEEN WBC/hpf (ref 0–5)

## 2016-05-07 LAB — URINALYSIS, ROUTINE W REFLEX MICROSCOPIC
BILIRUBIN URINE: NEGATIVE
Glucose, UA: NEGATIVE mg/dL
Ketones, ur: NEGATIVE mg/dL
Leukocytes, UA: NEGATIVE
NITRITE: NEGATIVE
PH: 5.5 (ref 5.0–8.0)
Protein, ur: NEGATIVE mg/dL
Specific Gravity, Urine: 1.029 (ref 1.005–1.030)

## 2016-05-07 LAB — LIPASE, BLOOD: LIPASE: 16 U/L (ref 11–51)

## 2016-05-07 LAB — PREGNANCY, URINE: PREG TEST UR: NEGATIVE

## 2016-05-07 LAB — HCG, QUANTITATIVE, PREGNANCY

## 2016-05-07 MED ORDER — ONDANSETRON 8 MG PO TBDP: ORAL_TABLET | ORAL | 0 refills | Status: AC

## 2016-05-07 MED ORDER — SODIUM CHLORIDE 0.9 % IV BOLUS (SEPSIS)
1000.0000 mL | Freq: Once | INTRAVENOUS | Status: AC
  Administered 2016-05-07: 1000 mL via INTRAVENOUS

## 2016-05-07 MED ORDER — ONDANSETRON HCL 4 MG/2ML IJ SOLN
4.0000 mg | Freq: Once | INTRAMUSCULAR | Status: AC
  Administered 2016-05-07: 4 mg via INTRAVENOUS
  Filled 2016-05-07: qty 2

## 2016-05-07 MED ORDER — KETOROLAC TROMETHAMINE 30 MG/ML IJ SOLN
30.0000 mg | Freq: Once | INTRAMUSCULAR | Status: AC
  Administered 2016-05-07: 30 mg via INTRAVENOUS
  Filled 2016-05-07: qty 1

## 2016-05-07 NOTE — ED Notes (Signed)
Pt made aware to return if symptoms worsen or if any life threatening symptoms occur.   

## 2016-05-07 NOTE — ED Provider Notes (Signed)
[facility] Provider Note   CSN: [id number] Arrival date & time: 05/07/16  1627     History   Chief Complaint Chief Complaint  Patient presents with  . Diarrhea  . Emesis    HPI Paula Bray is a 31 y.o. female.  Patient is a 31 year old female who presents with complaints of nausea, vomiting, and diarrhea for the past 2 days. She denies any bloody stool. She denies any fever. She does report generalized abdominal cramping. She states she is one day late for her., But otherwise denies any genitourinary complaints.   The history is provided by the patient.  Diarrhea   This is a new problem. The current episode started yesterday. The problem has been gradually worsening. There has been no fever. Associated symptoms include vomiting. She has tried nothing for the symptoms.  Emesis   Associated symptoms include diarrhea.    History reviewed. No pertinent past medical history.  There are no active problems to display for this patient.   Past Surgical History:  Procedure Laterality Date  . KNEE SURGERY      OB History    No data available       Home Medications    Prior to Admission medications   Medication Sig Start Date End Date Taking? Authorizing Provider  metoCLOPramide (REGLAN) 10 MG tablet Take 1 tablet (10 mg total) by mouth every 6 (six) hours as needed (nausea/headache). 02/24/12   Charlynne Pander, MD  nitrofurantoin, macrocrystal-monohydrate, (MACROBID) 100 MG capsule Take 1 capsule (100 mg total) by mouth 2 (two) times daily. X 7 days 02/24/12   Charlynne Pander, MD  ondansetron (ZOFRAN ODT) 8 MG disintegrating tablet 8mg  ODT q4 hours prn nausea 05/07/16   Geoffery Lyons, MD    Family History No family history on file.  Social History Social History  Substance Use Topics  . Smoking status: Former Smoker    Packs/day: 1.00    Types: Cigarettes  . Smokeless tobacco: Never Used  . Alcohol use Yes     Comment: social drinker      Allergies   Patient has no known allergies.   Review of Systems Review of Systems  Gastrointestinal: Positive for diarrhea and vomiting.  All other systems reviewed and are negative.    Physical Exam Updated Vital Signs BP 120/80 (BP Location: Left Arm)   Pulse 68   Temp 98.4 F (36.9 C) (Oral)   Resp 20   Ht 5\' 4"  (1.626 m)   Wt 257 lb (116.6 kg)   LMP 03/30/2016   SpO2 97%   BMI 44.11 kg/m   Physical Exam  Constitutional: She is oriented to person, place, and time. She appears well-developed and well-nourished. No distress.  HENT:  Head: Normocephalic and atraumatic.  Neck: Normal range of motion. Neck supple.  Cardiovascular: Normal rate and regular rhythm.  Exam reveals no gallop and no friction rub.   No murmur heard. Pulmonary/Chest: Effort normal and breath sounds normal. No respiratory distress. She has no wheezes.  Abdominal: Soft. Bowel sounds are normal. She exhibits no distension. There is no tenderness.  Musculoskeletal: Normal range of motion.  Neurological: She is alert and oriented to person, place, and time.  Skin: Skin is warm and dry. She is not diaphoretic.  Nursing note and vitals reviewed.    ED Treatments / Results  Labs (all labs ordered are listed, but only abnormal results are displayed) Labs Reviewed  COMPREHENSIVE METABOLIC PANEL - Abnormal; Notable for the following:  Result Value   Calcium 8.2 (*)    Total Bilirubin 0.2 (*)    All other components within normal limits  URINALYSIS, ROUTINE W REFLEX MICROSCOPIC - Abnormal; Notable for the following:    Color, Urine AMBER (*)    APPearance CLOUDY (*)    Hgb urine dipstick MODERATE (*)    All other components within normal limits  URINALYSIS, MICROSCOPIC (REFLEX) - Abnormal; Notable for the following:    Bacteria, UA RARE (*)    Squamous Epithelial / LPF 0-5 (*)    All other components within normal limits  LIPASE, BLOOD  CBC  HCG, QUANTITATIVE, PREGNANCY  PREGNANCY,  URINE    EKG  EKG Interpretation None       Radiology No results found.  Procedures Procedures (including critical care time)  Medications Ordered in ED Medications  sodium chloride 0.9 % bolus 1,000 mL (0 mLs Intravenous Stopped 05/07/16 1925)  ondansetron (ZOFRAN) injection 4 mg (4 mg Intravenous Given 05/07/16 1829)  ketorolac (TORADOL) 30 MG/ML injection 30 mg (30 mg Intravenous Given 05/07/16 1829)     Initial Impression / Assessment and Plan / ED Course  I have reviewed the triage vital signs and the nursing notes.  Pertinent labs & imaging results that were available during my care of the patient were reviewed by me and considered in my medical decision making (see chart for details).  Clinical Course     Abdomen is benign and laboratory studies are unremarkable. I highly suspect a viral etiology. I will recommend Zofran, clear liquids, and when necessary return.  Final Clinical Impressions(s) / ED Diagnoses   Final diagnoses:  Acute gastroenteritis    New Prescriptions Discharge Medication List as of 05/07/2016  7:13 PM    START taking these medications   Details  ondansetron (ZOFRAN ODT) 8 MG disintegrating tablet 8mg  ODT q4 hours prn nausea, Print         Geoffery Lyonsouglas Corin Formisano, MD 05/07/16 2212

## 2016-05-07 NOTE — Discharge Instructions (Signed)
Clear liquid diet for the next 12 hours, then slowly advance to normal. ° °Zofran as prescribed as needed for nausea. ° °Return to the emergency department if you develop severe abdominal pain, high fevers, bloody stools, or other new and concerning symptoms. °

## 2016-05-07 NOTE — ED Triage Notes (Signed)
Pt reports numerous episodes of diarrhea. Vomiting with certain smells. Pt reports she is a day late for her cycle.

## 2016-08-24 ENCOUNTER — Encounter (HOSPITAL_BASED_OUTPATIENT_CLINIC_OR_DEPARTMENT_OTHER): Payer: Self-pay

## 2016-08-24 ENCOUNTER — Emergency Department (HOSPITAL_BASED_OUTPATIENT_CLINIC_OR_DEPARTMENT_OTHER)
Admission: EM | Admit: 2016-08-24 | Discharge: 2016-08-24 | Disposition: A | Discharge status: Home / Self Care | Payer: Medicaid Other | Attending: Emergency Medicine | Admitting: Emergency Medicine

## 2016-08-24 DIAGNOSIS — Z79899 Other long term (current) drug therapy: Secondary | ICD-10-CM | POA: Insufficient documentation

## 2016-08-24 DIAGNOSIS — N760 Acute vaginitis: Secondary | ICD-10-CM | POA: Insufficient documentation

## 2016-08-24 DIAGNOSIS — Z87891 Personal history of nicotine dependence: Secondary | ICD-10-CM | POA: Insufficient documentation

## 2016-08-24 DIAGNOSIS — B9689 Other specified bacterial agents as the cause of diseases classified elsewhere: Secondary | ICD-10-CM

## 2016-08-24 LAB — URINALYSIS, MICROSCOPIC (REFLEX): WBC, UA: NONE SEEN WBC/hpf (ref 0–5)

## 2016-08-24 LAB — URINALYSIS, ROUTINE W REFLEX MICROSCOPIC
Glucose, UA: NEGATIVE mg/dL
KETONES UR: NEGATIVE mg/dL
LEUKOCYTES UA: NEGATIVE
NITRITE: NEGATIVE
PH: 5.5 (ref 5.0–8.0)
Protein, ur: NEGATIVE mg/dL
Specific Gravity, Urine: 1.029 (ref 1.005–1.030)

## 2016-08-24 LAB — WET PREP, GENITAL
TRICH WET PREP: NONE SEEN
YEAST WET PREP: NONE SEEN

## 2016-08-24 LAB — PREGNANCY, URINE: Preg Test, Ur: NEGATIVE

## 2016-08-24 MED ORDER — CEFTRIAXONE SODIUM 250 MG IJ SOLR
250.0000 mg | Freq: Once | INTRAMUSCULAR | Status: AC
  Administered 2016-08-24: 250 mg via INTRAMUSCULAR
  Filled 2016-08-24: qty 250

## 2016-08-24 MED ORDER — METRONIDAZOLE 500 MG PO TABS
500.0000 mg | ORAL_TABLET | Freq: Once | ORAL | Status: AC
  Administered 2016-08-24: 500 mg via ORAL
  Filled 2016-08-24: qty 1

## 2016-08-24 MED ORDER — AZITHROMYCIN 250 MG PO TABS
1000.0000 mg | ORAL_TABLET | Freq: Once | ORAL | Status: AC
  Administered 2016-08-24: 1000 mg via ORAL
  Filled 2016-08-24: qty 4

## 2016-08-24 MED ORDER — LIDOCAINE HCL (PF) 1 % IJ SOLN
INTRAMUSCULAR | Status: AC
  Administered 2016-08-24: 0.9 mL
  Filled 2016-08-24: qty 5

## 2016-08-24 NOTE — ED Triage Notes (Signed)
Pt reports vaginal discharge. Recently tx for trichomonas and doesn't think partner was tested & treated afterwards.

## 2016-08-24 NOTE — ED Notes (Signed)
ED Provider at bedside. 

## 2016-08-24 NOTE — ED Notes (Signed)
Pt states she recently found out that her partner may have herpes and wants to be checked for same. Describes white vaginal d/c and itching. Denies urinary s/s. Upper abd pain as well.
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
## 2016-08-24 NOTE — Discharge Instructions (Signed)
You have been seen today in the Emergency Department (ED) for pelvic pain and discharge.  Your workup today shows that you may have one or more sexually transmitted infections (STIs).  You have been treated with a one time dose medication for both of these conditions.  Please have your partner tested for STD's and do not resume sexual activity until you and your partner have confirmed negative test results or have both been treated.  Please follow up with your doctor as soon as possible regarding today?s ED visit and your symptoms.   Return to the ED if your pain worsens, you develop a fever, or for any other symptoms that concern you.

## 2016-08-25 LAB — GC/CHLAMYDIA PROBE AMP (~~LOC~~) NOT AT ARMC
Chlamydia: NEGATIVE
NEISSERIA GONORRHEA: NEGATIVE

## 2022-04-20 ENCOUNTER — Other Ambulatory Visit: Payer: Self-pay

## 2022-04-20 ENCOUNTER — Ambulatory Visit (HOSPITAL_COMMUNITY)
Admission: EM | Admit: 2022-04-20 | Discharge: 2022-04-20 | Disposition: A | Discharge status: Home / Self Care | Payer: No Typology Code available for payment source | Source: Ambulatory Visit | Attending: Emergency Medicine | Admitting: Emergency Medicine

## 2022-04-20 ENCOUNTER — Encounter (HOSPITAL_BASED_OUTPATIENT_CLINIC_OR_DEPARTMENT_OTHER): Payer: Self-pay | Admitting: Emergency Medicine

## 2022-04-20 ENCOUNTER — Emergency Department (HOSPITAL_BASED_OUTPATIENT_CLINIC_OR_DEPARTMENT_OTHER)
Admission: EM | Admit: 2022-04-20 | Discharge: 2022-04-20 | Disposition: A | Discharge status: Home / Self Care | Payer: Medicaid Other | Attending: Emergency Medicine | Admitting: Emergency Medicine

## 2022-04-20 DIAGNOSIS — I10 Essential (primary) hypertension: Secondary | ICD-10-CM | POA: Insufficient documentation

## 2022-04-20 DIAGNOSIS — T7421XA Adult sexual abuse, confirmed, initial encounter: Secondary | ICD-10-CM | POA: Diagnosis present

## 2022-04-20 DIAGNOSIS — Z0441 Encounter for examination and observation following alleged adult rape: Secondary | ICD-10-CM | POA: Insufficient documentation

## 2022-04-20 HISTORY — DX: Essential (primary) hypertension: I10

## 2022-04-20 MED ORDER — LORAZEPAM 1 MG PO TABS
1.0000 mg | ORAL_TABLET | ORAL | Status: AC
  Administered 2022-04-20: 1 mg via ORAL
  Filled 2022-04-20: qty 1

## 2022-04-20 MED ORDER — LORAZEPAM 1 MG PO TABS: 1.0000 mg | ORAL_TABLET | Freq: Three times a day (TID) | ORAL | 0 refills | Status: AC | PRN

## 2022-04-20 NOTE — ED Notes (Signed)
Spoke with Gerri RN SANE on call and ETA approximately 30 min
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
## 2022-04-21 LAB — POC URINE PREG, ED: Preg Test, Ur: NEGATIVE
